# Patient Record
Sex: Female | Born: 1960 | Race: White | Hispanic: No | Smoking: Current every day smoker
Health system: Southern US, Community
[De-identification: ages and names within clinical notes are randomized; demographics above are authoritative.]

---

## 1999-01-02 ENCOUNTER — Other Ambulatory Visit: Admission: RE | Admit: 1999-01-02 | Discharge: 1999-01-02 | Payer: Self-pay | Admitting: Obstetrics & Gynecology

## 2000-06-02 ENCOUNTER — Other Ambulatory Visit: Admission: RE | Admit: 2000-06-02 | Discharge: 2000-06-02 | Payer: Self-pay | Admitting: Obstetrics & Gynecology

## 2001-09-15 ENCOUNTER — Other Ambulatory Visit: Admission: RE | Admit: 2001-09-15 | Discharge: 2001-09-15 | Payer: Self-pay | Admitting: Obstetrics & Gynecology

## 2002-07-07 ENCOUNTER — Other Ambulatory Visit: Admission: RE | Admit: 2002-07-07 | Discharge: 2002-07-07 | Payer: Self-pay | Admitting: Obstetrics & Gynecology

## 2002-12-13 ENCOUNTER — Other Ambulatory Visit: Admission: RE | Admit: 2002-12-13 | Discharge: 2002-12-13 | Payer: Self-pay | Admitting: Obstetrics & Gynecology

## 2004-01-21 ENCOUNTER — Other Ambulatory Visit: Admission: RE | Admit: 2004-01-21 | Discharge: 2004-01-21 | Payer: Self-pay | Admitting: Obstetrics & Gynecology

## 2004-07-14 ENCOUNTER — Observation Stay (HOSPITAL_COMMUNITY): Admission: RE | Admit: 2004-07-14 | Discharge: 2004-07-15 | Payer: Self-pay | Admitting: Obstetrics and Gynecology

## 2005-03-12 ENCOUNTER — Other Ambulatory Visit: Admission: RE | Admit: 2005-03-12 | Discharge: 2005-03-12 | Payer: Self-pay | Admitting: Obstetrics & Gynecology

## 2005-04-14 ENCOUNTER — Ambulatory Visit (HOSPITAL_COMMUNITY): Admission: RE | Admit: 2005-04-14 | Discharge: 2005-04-14 | Payer: Self-pay | Admitting: Gastroenterology

## 2006-12-08 ENCOUNTER — Encounter: Admission: RE | Admit: 2006-12-08 | Discharge: 2006-12-08 | Payer: Self-pay | Admitting: Obstetrics & Gynecology

## 2009-04-22 ENCOUNTER — Encounter: Admission: RE | Admit: 2009-04-22 | Discharge: 2009-04-22 | Payer: Self-pay | Admitting: Obstetrics & Gynecology

## 2011-02-13 NOTE — Op Note (Signed)
NAME:  Norma Wilson, Norma Wilson NO.:  [id number]   MEDICAL RECORD NO.:  [id number]          PATIENT TYPE:  OBV   LOCATION:  9399                          FACILITY:  [facility]   PHYSICIAN:  Randye Lobo, M.D.   DATE OF BIRTH:  [birth year]   DATE OF PROCEDURE:  07/14/2004  DATE OF DISCHARGE:                                 OPERATIVE REPORT   PREOPERATIVE DIAGNOSES:  1.  Genuine stress incontinence.  2.  Cystocele.   POSTOPERATIVE DIAGNOSES:  1.  Genuine stress incontinence.  2.  Cystocele.   PROCEDURE:  TVT suburethral sling, cystoscopy, anterior colporrhaphy.   ANESTHESIA:  General endotracheal.   SURGEON:  Randye Lobo, M.D.   ASSISTANT:  Gerrit Friends. Aldona Bar, M.D.   INTRAVENOUS FLUIDS:  1200 mL Ringer's lactate.   ESTIMATED BLOOD LOSS:  100 mL.   URINE OUTPUT:  250 mL.   COMPLICATIONS:  None.   INDICATION FOR PROCEDURE:  The patient is a 50 year old para 1 Caucasian  female with a two-year history of urinary incontinence.  The patient has  urinary leakage with coughing, sneezing, and sporting activities.  Multichannel urodynamic testing in the office did confirm the presence of  genuine stress incontinence.  The patient on physical examination was noted  to have a minimal cystocele.   Now the patient desires surgical treatment of both the urinary incontinence  and the cystocele, and a plan was made to proceed with a TVT suburethral  sling, cystoscopy, and anterior colporrhaphy after risks, benefits, and  alternatives were discussed.   FINDINGS:  Examination under anesthesia did reveal a minimal cystocele.  Cystoscopy demonstrated the absence of a foreign body in the bladder or  urethra after placement of the abdominal needle passers and then finally  placement of the sling itself.  The bladder was visualized throughout 360  degrees and had a normal trigone and bladder dome.  The ureters were noted  to be patent bilaterally.   SPECIMENS:  None.   PROCEDURE:   The patient was reidentified in the preoperative hold area.  She  was brought down to the operating room, where general endotracheal  anesthesia was induced and she was then placed in a dorsal lithotomy  position.  The patient did have PAS stockings and TED hose for DVT  prophylaxis.   The patient's lower abdomen and the vagina and perineum were then sterilely  prepped and draped.  A Foley catheter was sterilely placed inside the  bladder.  The patient was examined under anesthesia and the findings are as  noted above.   A weighted speculum was placed inside the vagina and Allis clamps were used  to mark the midline of the anterior vaginal wall.  The vaginal mucosa was  then injected with 0.5% lidocaine with 1:200,000 of epinephrine.  A vertical  midline incision was created in the anterior vaginal wall using a scalpel.  A combination of sharp and blunt dissection was used to dissect the  subvaginal tissue and the bladder off of the overlying mucosa bilaterally.  This was carried back to the pubic rami.  Hemostasis was created with  monopolar cautery during the dissection.   The suprapubic sites for the sling were then marked approximately 2 cm to  the right and left of the midline.  The incisions were then created sharply  with a scalpel.  The TVT needles were passed from a top down fashion.  The  needle was passed first through the right suprapubic incision and out  through the vagina on the ipsilateral side and lateral to the midurethra.  The same procedure that was performed on the right was then repeated on the  left with that abdominal passer.   The Foley catheter was removed and cystoscopy was performed, and the  findings are as noted above.  The bladder was then emptied and the TVT sling  was attached to the abdominal needle passers and brought up through the  suprapubic incisions.  One final cystoscopy was performed, and again the  findings are as noted above.   Excess mesh  was then trimmed away and the final tension on the sling was  adjusted and the plastic sheaths were removed as this was performed.  Again  the sling was trimmed at the suprapubic sites.   There was some bleeding noted in a venous plexus overlying the bladder on  the patient's left-hand side below the site of the suburethral sling.  This  was made hemostatic with interrupted sutures of 2-0 Vicryl.  Two  colporrhaphy sutures, which were vertical mattress sutures, were then placed  to reduce the small cystocele.  Excess vaginal mucosa was trimmed away and  the vagina was closed with a running locked suture of 2-0 Vicryl.   The Foley catheter was left to drainage.  The suprapubic incision sites were  hemostatic and were closed with Dermabond.   This concluded the patient's procedure.  There were no complications.  The  patient is escorted to the recovery room in stable and awake condition.  All  needle, instrument, and sponge counts are correct.     Broo   BES/MEDQ  D:  07/14/2004  T:  07/15/2004  Job:  [id number]

## 2011-02-13 NOTE — H&P (Signed)
NAME:  Norma Wilson, Norma Wilson NO.:  [id number]   MEDICAL RECORD NO.:  [id number]          PATIENT TYPE:  OBV   LOCATION:  NA                            [facility]   PHYSICIAN:  Randye Lobo, M.D.   DATE OF BIRTH:  Feb 14, 1961   DATE OF ADMISSION:  DATE OF DISCHARGE:                                HISTORY & PHYSICAL   CHIEF COMPLAINT:  Urinary incontinence.   HISTORY OF PRESENT ILLNESS:  The patient is a 50 year old para 1 Caucasian  female with a history of urinary leakage of two years' duration.  The  patient reports leakage of urine with coughing, sneezing, and physical  activity which does require the use of absorbent pads.  The patient also  reports a history of urinary frequency with no evidence of urge  incontinence.  The patient had no improvement of her symptoms with the use  of Detrol.  The patient did have multichannel urodynamic studies performed  which documented the presence of genuine stress incontinence with a stable  cystometrogram.  The patient was noted to have a small bladder volume of 226  cc at maximum capacity.  The patient's uroflowmetry study was normal, with a  voided volume of 69 cc and a postvoid residual of 8 cc.  The pressure flow  study was unremarkable.   The patient wishes for surgical treatment of her urinary incontinence.   PAST OBSTETRIC AND GYNECOLOGICAL HISTORY:  The patient has had one vaginal  delivery.  Her menses occur every 28-38 days.  She uses vasectomy for birth  control.  Her last Pap smear was performed in April of 2005 and was within  normal limits.  Her last mammogram was performed in March of 2004 and was  within normal limits.   PAST MEDICAL AND SURGICAL HISTORY:  1.  History of urinary tract infections.  The patient denies a history of      pyelonephritis and nephrolithiasis.  2.  Vertigo.  3.  Insomnia.  4.  Status post appendectomy.   MEDICATIONS:  Topamax 100 mg p.o. daily.   SOCIAL HISTORY:  The  patient is married.  She works as a Engineer, water.  She  has one son.  The patient has recently restarted smoking.  She denies the  use of alcohol.   FAMILY HISTORY:  Significant for cirrhosis of the liver.   PHYSICAL EXAMINATION:  VITAL SIGNS:  Blood pressure 100/60.  Weight 126  pounds, height 5 feet 1.5 inches.  GENERAL:  The patient is a middle-aged Caucasian female in no acute  distress.  HEENT:  Normocephalic, atraumatic.  LUNGS:  Clear to auscultation bilaterally.  HEART:  S1, S2, with a regular rate and rhythm.  ABDOMEN:  Soft and nontender and without evidence of hepatosplenomegaly or  organomegaly.  PELVIC:  Normal external genitalia and urethra.  There is a minimal  cystocele appreciated.  The cervix demonstrates no lesions.  The uterus is  noted to be small and nontender.  There is good vaginal apex and uterine  suspension.  No adnexal masses are appreciated.  IMPRESSION:  The patient is a 50 year old para 1 female with urodynamically-  proven genuine stress incontinence.   PLAN:  The patient will undergo a tension-free vaginal tape with cystoscopy  and anterior colporrhaphy at the [facility] [place] on July 14, 2004.  The risks, benefits, and alternatives have been discussed with  the patient who wishes to proceed.     Broo   BES/MEDQ  D:  07/13/2004  T:  07/14/2004  Job:  [id number]

## 2011-02-13 NOTE — Op Note (Signed)
NAME:  DAMON, BAISCH NO.:  [id number]   MEDICAL RECORD NO.:  [id number]          PATIENT TYPE:  AMB   LOCATION:  ENDO                         FACILITY:  [facility]   PHYSICIAN:  Danise Edge, M.D.   DATE OF BIRTH:  08-31-1961   DATE OF PROCEDURE:  04/14/2005  DATE OF DISCHARGE:                                 OPERATIVE REPORT   PROCEDURE:  Diagnostic colonoscopy.   INDICATIONS:  Ms. Emya Picado is a 50 year old female, born 04/27/1961.  Ms. Sommerfield underwent her health maintenance and physical exam performed by Dr.  Annamaria Helling.  She submitted stool for guaiac testing.  Her stool was guaiac  positive.   ENDOSCOPIST:  Dr. Reece Agar   PREMEDICATION:  Versed 9 mg, Demerol 70 mg.   PROCEDURE:  After obtaining informed consent, Ms. Hackel was placed in the  left lateral decubitus position.  I administered intravenous Demerol and  intravenous Versed to achieve conscious sedation for the procedure.  The  patient's blood pressure, oxygen saturation, and cardiac rhythm were  monitored throughout the procedure and documented in the medical record.   Anal inspection and digital rectal exam were normal.  The Olympus adjustable  pediatric colonoscope was introduced into the rectum and advanced to the  cecum.  A normal-appearing ileocecal valve was intubated and the distal  ileum inspected.  Colonic preparation for the exam today was satisfactory.   Rectum normal.  Sigmoid colon and descending colon normal.  Splenic flexure normal.  Transverse colon normal.  Hepatic flexure normal.  Ascending colon normal.  Cecum and ileocecal valve normal.  Distal ileum normal.   ASSESSMENT:  Normal proctocolonoscopy to the cecum with distal ileal  inspection.       MJ/MEDQ  D:  04/14/2005  T:  04/14/2005  Job:  [id number]   cc:   Gerrit Friends. Aldona Bar, M.D.  [address], [address]  [place]  [place] [address]  Fax: [phone]

## 2011-02-13 NOTE — Discharge Summary (Signed)
Norma Wilson, Norma Wilson NO.:  [id number]   MEDICAL RECORD NO.:  [id number]          PATIENT TYPE:  OBV   LOCATION:  9319                          FACILITY:  [facility]   PHYSICIAN:  Randye Lobo, M.D.   DATE OF BIRTH:  December 02, 1960   DATE OF ADMISSION:  07/14/2004  DATE OF DISCHARGE:  07/15/2004                                 DISCHARGE SUMMARY   ADMISSION DIAGNOSIS:  1.  Genuine stress incontinence.  2.  Cystocele.   DISCHARGE DIAGNOSES:  1.  Genuine stress incontinence.  2.  Cystocele.   OPERATIONS AND PROCEDURES:  The patient underwent a tension-free vaginal  tape, suburethral sling, with cystoscopy and anterior colporrhaphy on  July 14, 2004, at the [facility] under the direction  of Dr. Conley Simmonds and with the assistance of Dr. Annamaria Helling.   ADMISSION HISTORY AND PHYSICAL:  The patient is a 50 year old para 1  Caucasian female with a history of urinary incontinence of two years  duration.  The patient had leakage of urine with coughing, sneezing and  playing sports, and she reported that this was a social problem for her.  The patient did have urodynamically proven genuine stress incontinence in  the office.   The patient's physical examination was significant for a minimal cystocele.  The cervix was normal, and the vaginal apex had excellent support.  The  uterus was small and nontender.  There was no evidence of a rectocele  appreciated.  There were no adnexal masses.   The patient wished for surgical treatment of the cystocele and the urinary  incontinence, and a plan was therefore made to proceed with a suburethral  sling, cystoscopy and anterior colporrhaphy.   HOSPITAL COURSE:  The patient was admitted on July 14, 2004, at which  time she underwent a tension-free vaginal tape suburethral sling, cystoscopy  and anterior colporrhaphy at the [facility] [place].  Findings  at surgery documented a minimal cystocele.   Cystoscopy documented patent  ureters bilaterally with no evidence of a foreign body in the bladder or  urethra.  The patient's surgery was uncomplicated, and the estimated blood  loss was 100 cc.   Postoperatively, the patient had an unremarkable surgical recovery.  The  Foley catheter was left to gravity drainage overnight.  On postoperative day  #1, the patient began bladder trials.  With her first trial, she was unable  to void.  She had a postvoid residual of 400 cc.  The plan will be for the  patient to continue with her bladder trials on postop day #1, and if she is  voiding well prior to discharge, she will be discharged without a catheter  in place.   The patient's pain was initially controlled with a Dilaudid PCA and  Ketorolac.  The patient was successfully converted over to oral Percocet and  ibuprofen on postoperative day #1.  The patient's diet has been advanced to  normal which she is tolerating well.  She is able to ambulate independently.   The patient's suprapubic incisions remained dry and intact.  She had minimal  vaginal bleeding throughout her hospital course.  Her discharge hemoglobin  is 10.3, and she is tolerating this well.   The patient was found to be in good condition and ready for discharge on  postoperative day #1.   DISCHARGE INSTRUCTIONS:  1.  Discharge to home.  2.  The patient will be discharged to home with a Foley catheter and a leg      bag if she is unable to void adequately spontaneously.  3.  The patient will take Percocet 1-2 p.o. q.4-6h. p.r.n. pain and      ibuprofen 600 mg p.o. q.6h. p.r.n. pain.  4.  The patient will follow a regular diet.  5.  The patient will have decreased activity for the next six weeks.  6.  The patient will follow up in three days in the office for continued      bladder trial if she has her Foley catheter in place.  She will follow      up in four weeks if she is discharged to home without a catheter.  7.  The  patient will call if she experiences fever, heavy vaginal bleeding,      pain uncontrolled by her medication, difficulty with voiding, any      problems with drainage or redness from the incisions, or any other      concern.     Broo   BES/MEDQ  D:  07/15/2004  T:  07/15/2004  Job:  [id number]

## 2012-01-05 ENCOUNTER — Other Ambulatory Visit: Payer: Self-pay | Admitting: Otolaryngology

## 2012-01-05 DIAGNOSIS — K219 Gastro-esophageal reflux disease without esophagitis: Secondary | ICD-10-CM

## 2012-01-13 ENCOUNTER — Ambulatory Visit
Admission: RE | Admit: 2012-01-13 | Discharge: 2012-01-13 | Disposition: A | Discharge status: Home / Self Care | Payer: BC Managed Care – PPO | Source: Ambulatory Visit | Attending: Otolaryngology | Admitting: Otolaryngology

## 2012-01-13 DIAGNOSIS — K219 Gastro-esophageal reflux disease without esophagitis: Secondary | ICD-10-CM

## 2012-01-20 ENCOUNTER — Other Ambulatory Visit: Payer: Self-pay

## 2014-04-24 ENCOUNTER — Emergency Department (HOSPITAL_BASED_OUTPATIENT_CLINIC_OR_DEPARTMENT_OTHER)
Admission: EM | Admit: 2014-04-24 | Discharge: 2014-04-24 | Disposition: A | Discharge status: Home / Self Care | Payer: BC Managed Care – PPO | Attending: Emergency Medicine | Admitting: Emergency Medicine

## 2014-04-24 ENCOUNTER — Emergency Department (HOSPITAL_BASED_OUTPATIENT_CLINIC_OR_DEPARTMENT_OTHER): Payer: BC Managed Care – PPO

## 2014-04-24 ENCOUNTER — Encounter (HOSPITAL_BASED_OUTPATIENT_CLINIC_OR_DEPARTMENT_OTHER): Payer: Self-pay | Admitting: Emergency Medicine

## 2014-04-24 DIAGNOSIS — M549 Dorsalgia, unspecified: Secondary | ICD-10-CM

## 2014-04-24 DIAGNOSIS — R1110 Vomiting, unspecified: Secondary | ICD-10-CM | POA: Insufficient documentation

## 2014-04-24 DIAGNOSIS — M545 Low back pain, unspecified: Secondary | ICD-10-CM | POA: Insufficient documentation

## 2014-04-24 DIAGNOSIS — F17200 Nicotine dependence, unspecified, uncomplicated: Secondary | ICD-10-CM | POA: Insufficient documentation

## 2014-04-24 DIAGNOSIS — R197 Diarrhea, unspecified: Secondary | ICD-10-CM | POA: Insufficient documentation

## 2014-04-24 LAB — URINALYSIS, ROUTINE W REFLEX MICROSCOPIC
Bilirubin Urine: NEGATIVE
GLUCOSE, UA: NEGATIVE mg/dL
HGB URINE DIPSTICK: NEGATIVE
KETONES UR: NEGATIVE mg/dL
Leukocytes, UA: NEGATIVE
NITRITE: NEGATIVE
PH: 6.5 (ref 5.0–8.0)
Protein, ur: NEGATIVE mg/dL
Specific Gravity, Urine: 1.025 (ref 1.005–1.030)
Urobilinogen, UA: 1 mg/dL (ref 0.0–1.0)

## 2014-04-24 MED ORDER — IBUPROFEN 800 MG PO TABS
800.0000 mg | ORAL_TABLET | Freq: Once | ORAL | Status: AC
  Administered 2014-04-24: 800 mg via ORAL
  Filled 2014-04-24: qty 1

## 2014-04-24 NOTE — ED Provider Notes (Signed)
CSN: [id number]     Arrival date & time 04/24/14  1737 History  This chart was scribed for Junius Argyle, MD by Julian Hy, ED Scribe. The patient was seen in MH01/MH01. The patient's care was started at 6:30 PM.   Chief Complaint  Patient presents with  . Back Pain    Patient is a 53 y.o. female presenting with back pain. The history is provided by the patient. No language interpreter was used.  Back Pain Location:  Lumbar spine Quality:  Unable to specify Radiates to:  Does not radiate Pain severity:  Moderate Pain is:  Unable to specify Onset quality:  Sudden Duration:  1 day Timing:  Constant Progression:  Worsening Chronicity:  New Worsened by:  Bending Associated symptoms: no abdominal pain, no chest pain, no dysuria, no fever, no headaches, no numbness and no weakness   Risk factors: no hx of cancer    HPI Comments: Norma Wilson is a 53 y.o. female who presents to the Emergency Department complaining of new, sudden, moderate lower back pain onset one day ago. Pt denies injury to the area. Pt reports her back pain is worsened when she stands up and sits down. Pt rates her pain as 8/10. Pt states her pain began after completing her pain medication after her root canal last Thursday. Pt states she had associated vomiting and diarrhea that has since been resolved. Pt also reports a toothache that started after her root canal. Pt also reports pressure in her lower abdomen. Pt denies fever, chills, dysuria, hematuria, urinary incontinence, urgency, nausea, headaches, bowel incontinence, blood in stool, weakness, numbness.   Pt denies using drugs. Pt denies history of cancer, diabetes or back pain.  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    History reviewed. No pertinent past medical history. History reviewed. No pertinent past surgical history. No family history on file. History  Substance Use Topics  . Smoking status: Current Every Day Smoker -- 1.00 packs/day    Types:  Cigarettes  . Smokeless tobacco: Not on file  . Alcohol Use: No   OB History   Grav Para Term Preterm Abortions TAB SAB Ect Mult Living                 Review of Systems  Constitutional: Negative for fever and fatigue.  HENT: Negative for congestion and drooling.   Eyes: Negative for pain.  Respiratory: Negative for cough and shortness of breath.   Cardiovascular: Negative for chest pain.  Gastrointestinal: Positive for vomiting and diarrhea. Negative for nausea and abdominal pain.  Genitourinary: Negative for dysuria, urgency and hematuria.  Musculoskeletal: Positive for back pain. Negative for neck pain.  Skin: Negative for color change.  Neurological: Negative for dizziness, weakness, numbness and headaches.  Hematological: Negative for adenopathy.  Psychiatric/Behavioral: Negative for behavioral problems.  All other systems reviewed and are negative.  Allergies  Review of patient's allergies indicates no known allergies.  Home Medications   Prior to Admission medications   Not on File   Triage Vitals: BP 127/62  Pulse 102  Temp(Src) 97.9 F (36.6 C)  Resp 18  Ht 5\' 2"  (1.575 m)  Wt 140 lb (63.504 kg)  BMI 25.60 kg/m2  SpO2 100% Physical Exam  Nursing note and vitals reviewed. Constitutional: She appears well-developed and well-nourished. No distress.  HENT:  Head: Normocephalic and atraumatic.  Mouth/Throat: Oropharynx is clear and moist. No oropharyngeal exudate.  Eyes: Conjunctivae are normal. Pupils are equal, round, and reactive to  light.  Neck: Normal range of motion. Neck supple.  Cardiovascular: Normal rate, regular rhythm and intact distal pulses.   Pulmonary/Chest: Effort normal and breath sounds normal. No respiratory distress. She has no wheezes.  Abdominal: Soft. She exhibits no distension. There is no tenderness.  Musculoskeletal: She exhibits no edema.  Mild tenderness to palpation of the mid to lower lumbar spine.  Lymphadenopathy:    She has  no cervical adenopathy.  Neurological: She is alert. She has normal reflexes.  Reflex Scores:      Patellar reflexes are 2+ on the right side and 2+ on the left side.      Achilles reflexes are 2+ on the right side and 2+ on the left side. Normal gait.  Normal strength and sensation in the lower extremities bilaterally.   Skin: Skin is warm and dry. No rash noted. She is not diaphoretic. No erythema.  Psychiatric: She has a normal mood and affect. Her behavior is normal.    ED Course  Procedures (including critical care time) DIAGNOSTIC STUDIES: Oxygen Saturation is 100% on RA, normal by my interpretation.    COORDINATION OF CARE: 6:35 PM- Will order UA, back x-ray and 800 MG ibuprofen. Patient informed of current plan for treatment and evaluation and agrees with plan at this time.  Labs Review Labs Reviewed  URINALYSIS, ROUTINE W REFLEX MICROSCOPIC - Abnormal; Notable for the following:    APPearance CLOUDY (*)    All other components within normal limits    Imaging Review Dg Lumbar Spine Complete  04/24/2014   CLINICAL DATA:  Low back pain  EXAM: LUMBAR SPINE - COMPLETE 4+ VIEW  COMPARISON:  01/26/2011  FINDINGS: Mild bony demineralization. Prominent stool throughout the colon favors constipation.  No fracture identified. There is 3 mm degenerative retrolisthesis at L2-3. Mild spurring anterior to the vertebral body column at L2-3 and L3-4.  IMPRESSION: 1. Mild lumbar spondylosis. No fracture or acute subluxation identified. 2.  Prominent stool throughout the colon favors constipation.   Electronically Signed   By: Herbie BaltimoreWalt  Liebkemann M.D.   On: 04/24/2014 19:49    MDM   Final diagnoses:  Back pain, unspecified location    8:24 PM 53 y.o. female here w/ back pain. No injury. No BP red flags. Pt well appearing. Imaging non-contrib. Pain improved w/ ibuprofen, likely msk cause.   8:24 PM:  I have discussed the diagnosis/risks/treatment options with the patient and believe the pt  to be eligible for discharge home to follow-up with her pcp as needed. We also discussed returning to the ED immediately if new or worsening sx occur. We discussed the sx which are most concerning (e.g., worsening pain, BP red flags discussed) that necessitate immediate return. Medications administered to the patient during their visit and any new prescriptions provided to the patient are listed below.  Medications given during this visit Medications  ibuprofen (ADVIL,MOTRIN) tablet 800 mg (800 mg Oral Given 04/24/14 1913)    New Prescriptions   No medications on file      I personally performed the services described in this documentation, which was scribed in my presence. The recorded information has been reviewed and is accurate.    Junius ArgyleForrest S Messiyah Waterson, MD 04/25/14 1000

## 2014-04-24 NOTE — ED Notes (Signed)
Pt reports lower back pain x 2 days. No urinary symptoms.

## 2014-04-24 NOTE — ED Notes (Signed)
MD at bedside. 

## 2014-04-24 NOTE — Discharge Instructions (Signed)
Take 600 mg of ibuprofen OR Tylenol every 6 hours for pain for the next 2-3 days.

## 2018-04-29 ENCOUNTER — Other Ambulatory Visit: Payer: Self-pay | Admitting: Sports Medicine

## 2018-04-29 DIAGNOSIS — S83207A Unspecified tear of unspecified meniscus, current injury, left knee, initial encounter: Secondary | ICD-10-CM

## 2018-05-04 ENCOUNTER — Ambulatory Visit
Admission: RE | Admit: 2018-05-04 | Discharge: 2018-05-04 | Disposition: A | Discharge status: Home / Self Care | Payer: BLUE CROSS/BLUE SHIELD | Source: Ambulatory Visit | Attending: Sports Medicine | Admitting: Sports Medicine

## 2018-05-04 DIAGNOSIS — S83207A Unspecified tear of unspecified meniscus, current injury, left knee, initial encounter: Secondary | ICD-10-CM

## 2019-03-10 IMAGING — MR MR KNEE*L* W/O CM
4 of 6 series · 23 of 40 positions shown · non-contrast
Comparison: None.

CLINICAL DATA: Chronic left knee pain with popping and swelling.

EXAM:
MRI OF THE LEFT KNEE WITHOUT CONTRAST
TECHNIQUE: Multiplanar, multisequence MR imaging of the knee was performed. No
intravenous contrast was administered.

[Series 3: T2 fat-sat · axial · 4.0mm · 0.50mm/px · z∈[-114,+1]mm · 7 of 28 slices shown (1 of 2)]
[im 1/28]
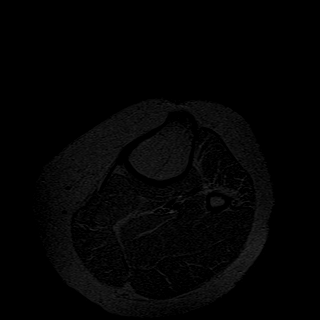
[im 4/28]
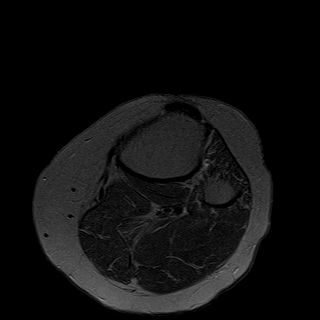
[im 8/28]
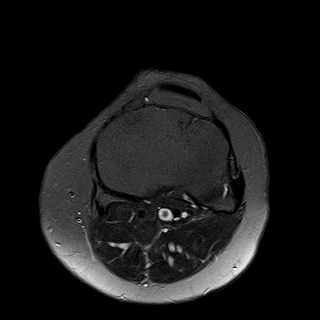
[im 12/28]
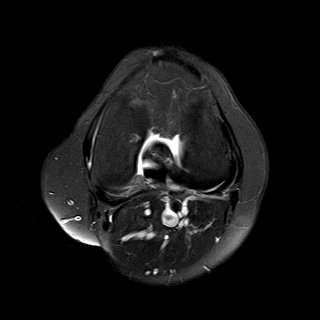
[im 16/28]
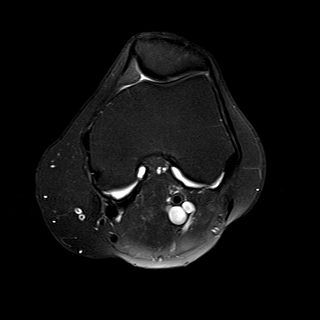
[im 20/28]
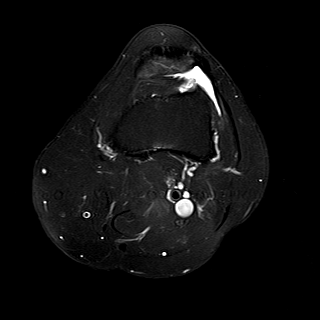
[im 24/28]
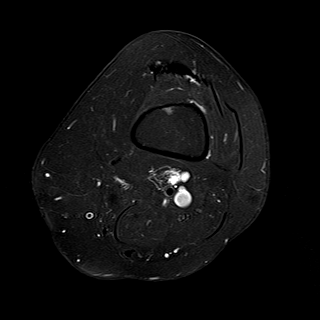

[Series 5: T2 fat-sat · coronal · 4.0mm · 0.29mm/px · 3 of 26 slices shown (2 of 2)]
[im 6/26]
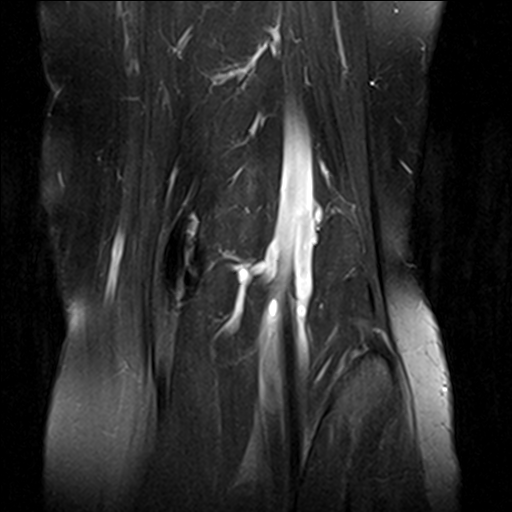
[im 16/26]
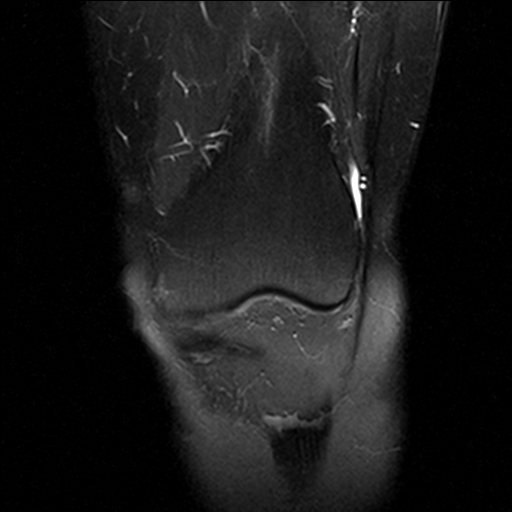
[im 26/26]
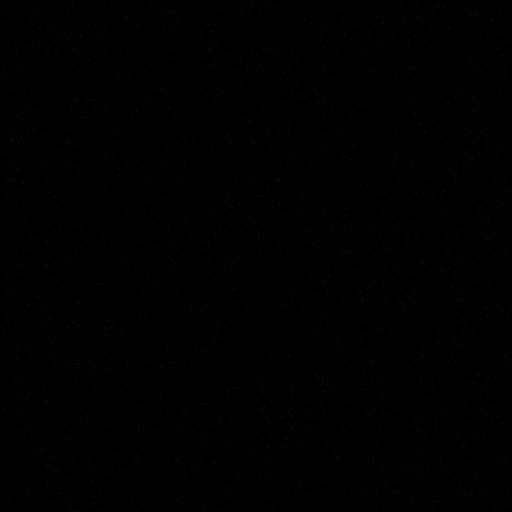

[Series 7: PD fat-sat · sagittal · 3.0mm · 0.29mm/px · 7 of 27 slices shown (1 of 2)]
[im 1/27]
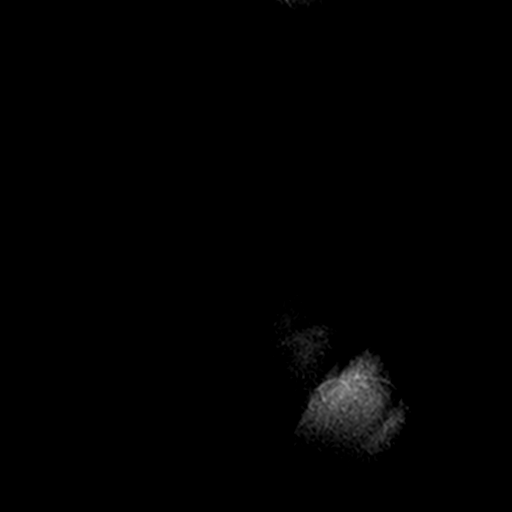
[im 5/27]
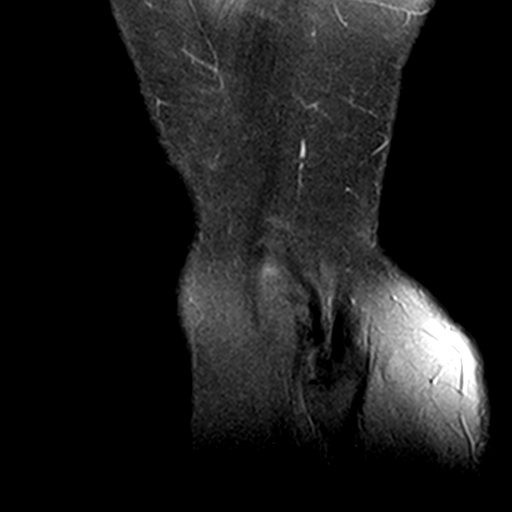
[im 9/27]
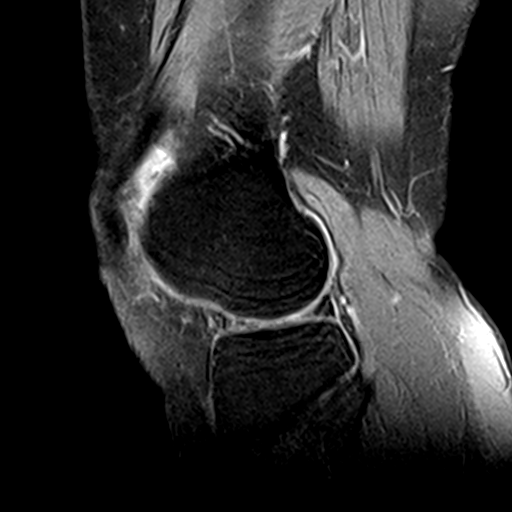
[im 14/27]
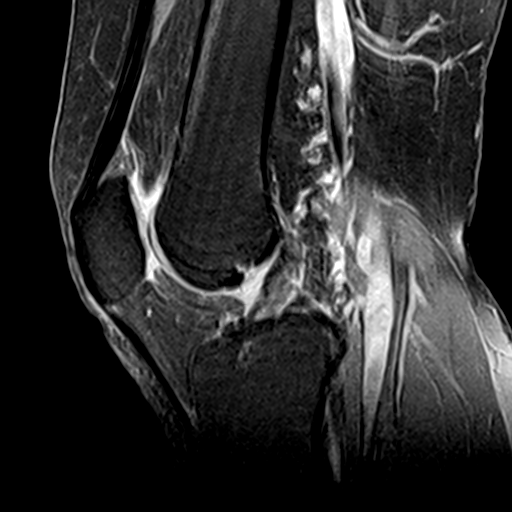
[im 18/27]
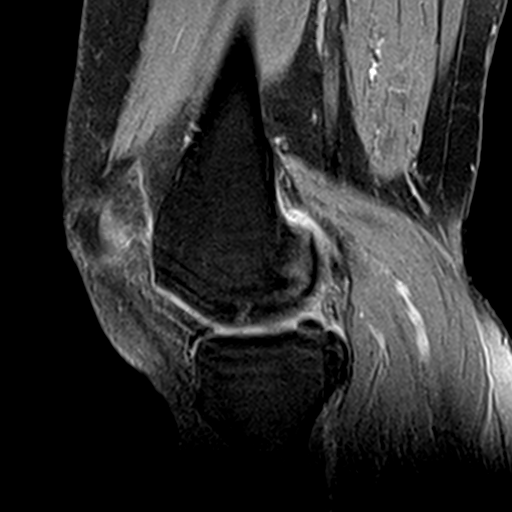
[im 22/27]
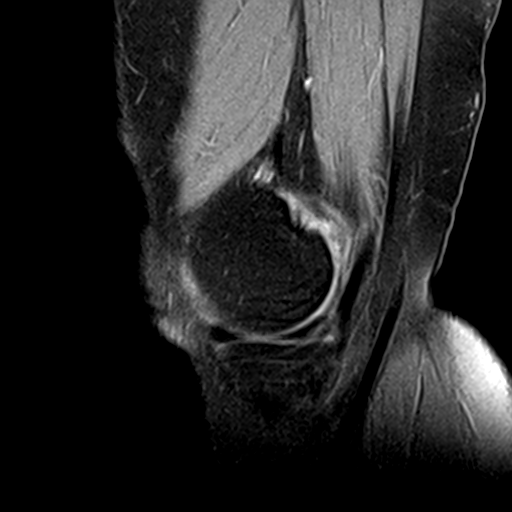
[im 27/27]
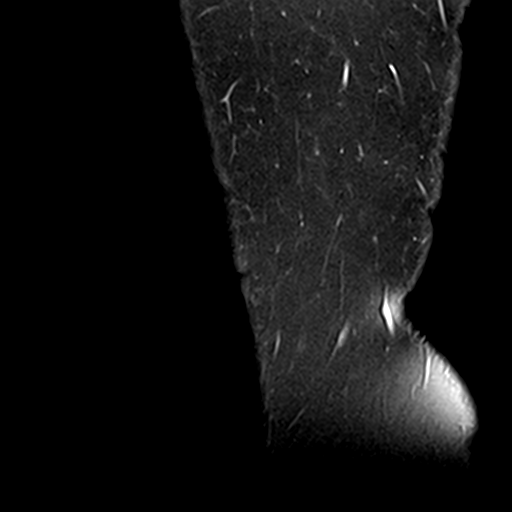

[Series 8: PD fat-sat · coronal · 4.0mm · 0.29mm/px · 6 of 26 slices shown (2 of 2)]
[im 1/26]
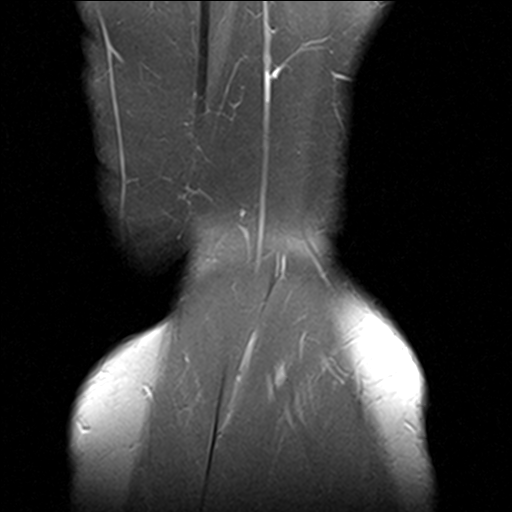
[im 6/26]
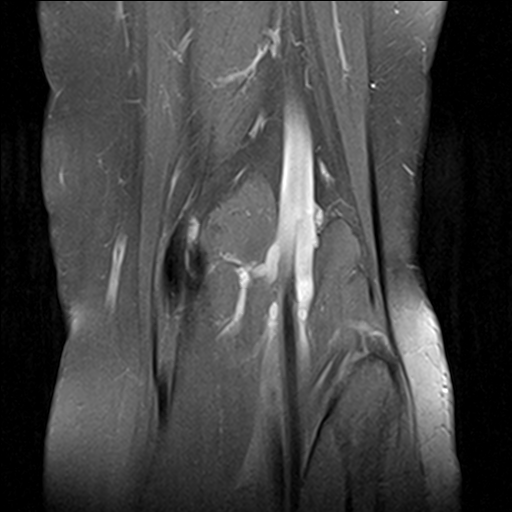
[im 11/26]
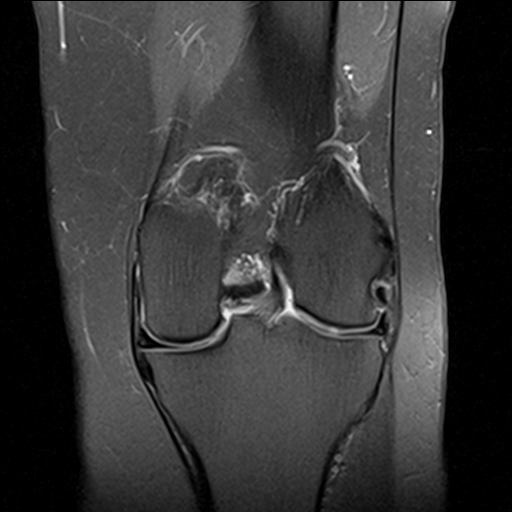
[im 16/26]
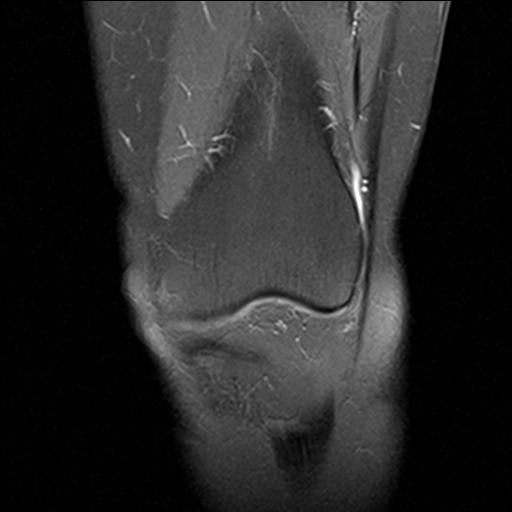
[im 21/26]
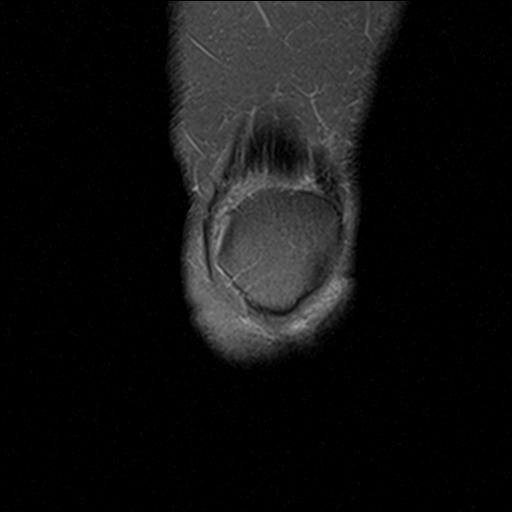
[im 26/26]
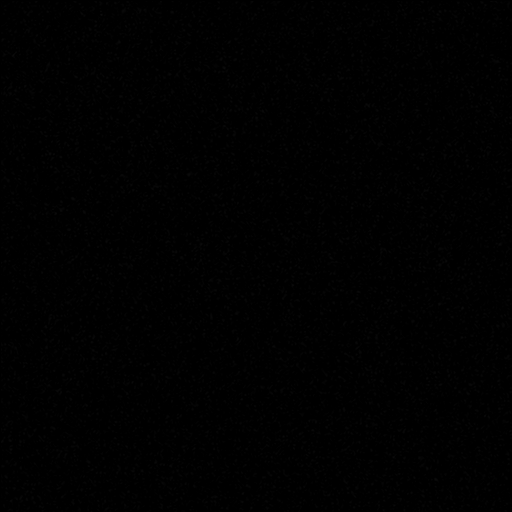

[23 of 40 positions shown; findings below may reference images not displayed]

FINDINGS: Despite efforts by the technologist and patient, motion artifact is
present on today's exam and could not be eliminated. This reduces
exam sensitivity and specificity.

MENISCI

Medial meniscus: Mild grade 1 signal in the posterior horn and
midbody without surface extension.

Lateral meniscus: Suspected complex tear of the anterior horn on
image [DATE] and image [DATE], difficult to characterize further given
the degree of motion artifact, but without a definite bucket-handle
component.

LIGAMENTS

Cruciates:  Unremarkable

Collaterals:  Unremarkable

CARTILAGE

Patellofemoral:  Mild degenerative chondral thinning.

Medial: Moderate degenerative chondral thinning with minimal
marginal spurring.

Lateral: 0.4 by 0.8 cm full-thickness focal chondral defect
posteriorly along the lateral femoral condyle, images 10-11 series
5.

Joint:  Small knee effusion.

Popliteal Fossa: Small Baker's cyst. Incidental small lymph nodes
adjacent to the distal SFA.

Extensor Mechanism:  Unremarkable

Bones: Small geode along the tibial spine anteriorly. Small focus of
subcortical marrow edema laterally in the medial femoral condyle on
image [DATE].

Other: No supplemental non-categorized findings.
IMPRESSION: 1. Complex tear anterior horn lateral meniscus.
2. Mild-to-moderate osteoarthritis. Focal full-thickness chondral
defect posteriorly along the lateral femoral condyle.
3. Small knee effusion and small Baker's cyst.
